# Patient Record
Sex: Male | Born: 1995 | Race: Black or African American | Hispanic: No | Marital: Single | Smoking: Never smoker
Health system: Southern US, Community
[De-identification: ages and names within clinical notes are randomized; demographics above are authoritative.]

## PROBLEM LIST (undated history)

## (undated) ENCOUNTER — Emergency Department (HOSPITAL_COMMUNITY): Admission: EM | Payer: Self-pay | Source: Home / Self Care

---
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
## 2019-11-25 ENCOUNTER — Other Ambulatory Visit: Payer: Self-pay

## 2019-11-25 ENCOUNTER — Emergency Department (HOSPITAL_BASED_OUTPATIENT_CLINIC_OR_DEPARTMENT_OTHER)
Admission: EM | Admit: 2019-11-25 | Discharge: 2019-11-25 | Disposition: A | Discharge status: Home / Self Care | Payer: Self-pay | Attending: Emergency Medicine | Admitting: Emergency Medicine

## 2019-11-25 ENCOUNTER — Encounter (HOSPITAL_BASED_OUTPATIENT_CLINIC_OR_DEPARTMENT_OTHER): Payer: Self-pay | Admitting: Emergency Medicine

## 2019-11-25 DIAGNOSIS — R519 Headache, unspecified: Secondary | ICD-10-CM | POA: Insufficient documentation

## 2019-11-25 DIAGNOSIS — R109 Unspecified abdominal pain: Secondary | ICD-10-CM | POA: Insufficient documentation

## 2019-11-25 DIAGNOSIS — Z20822 Contact with and (suspected) exposure to covid-19: Secondary | ICD-10-CM | POA: Insufficient documentation

## 2019-11-25 LAB — RESPIRATORY PANEL BY RT PCR (FLU A&B, COVID)
Influenza A by PCR: NEGATIVE
Influenza B by PCR: NEGATIVE
SARS Coronavirus 2 by RT PCR: NEGATIVE

## 2019-11-25 NOTE — ED Triage Notes (Signed)
Pt didn't feel good at work 3 days ago and was told to get a COVID test. States he feels fine now.

## 2019-11-25 NOTE — ED Provider Notes (Signed)
[facility] EMERGENCY DEPARTMENT Provider Note   CSN: [id number] Arrival date & time: 11/25/19  1615     History Chief Complaint  Patient presents with  . COVID test    Allen Mullen is a 24 y.o. male with no known past medical history.  HPI Patient presents to emergency department today with chief complaint of needing a Covid test.  Patient states he was at work x3 days ago and he had a headache and normalized abdominal pain.  The headache felt like once he has had in the past.  He states he did not feel good and want to go home.  His employer said in order to return to work he would need a Covid test.  Patient states after going home and resting all the symptoms have resolved.  No medications were needed for his symptoms.  He is currently asymptomatic.    History reviewed. No pertinent past medical history.  There are no problems to display for this patient.   History reviewed. No pertinent surgical history.     No family history on file.  Social History   Tobacco Use  . Smoking status: Never Smoker  . Smokeless tobacco: Never Used  Substance Use Topics  . Alcohol use: No  . Drug use: No    Home Medications Prior to Admission medications   Not on File    Allergies    Patient has no known allergies.  Review of Systems   Review of Systems All other systems are reviewed and are negative for acute change except as noted in the HPI.  Physical Exam Updated Vital Signs BP 122/65 (BP Location: Left Arm)   Pulse 88   Temp 98.2 F (36.8 C) (Oral)   Resp 18   Ht 6\' 1"  (1.854 m)   Wt 106.6 kg   SpO2 97%   BMI 31.00 kg/m   Physical Exam Vitals and nursing note reviewed.  Constitutional:      Appearance: He is well-developed. He is not ill-appearing or toxic-appearing.  HENT:     Head: Normocephalic and atraumatic.     Nose: Nose normal.  Eyes:     General: No scleral icterus.       Right eye: No discharge.        Left eye: No discharge.      Conjunctiva/sclera: Conjunctivae normal.  Neck:     Vascular: No JVD.  Cardiovascular:     Rate and Rhythm: Normal rate and regular rhythm.     Pulses: Normal pulses.     Heart sounds: Normal heart sounds.  Pulmonary:     Effort: Pulmonary effort is normal.     Breath sounds: Normal breath sounds.  Abdominal:     General: There is no distension.  Musculoskeletal:        General: Normal range of motion.     Cervical back: Normal range of motion.  Skin:    General: Skin is warm and dry.  Neurological:     Mental Status: He is oriented to person, place, and time.     GCS: GCS eye subscore is 4. GCS verbal subscore is 5. GCS motor subscore is 6.     Comments: Fluent speech, no facial droop.  Psychiatric:        Behavior: Behavior normal.     ED Results / Procedures / Treatments   Labs (all labs ordered are listed, but only abnormal results are displayed) Labs Reviewed  RESPIRATORY PANEL BY RT PCR (  FLU A&B, COVID)    EKG None  Radiology No results found.  Procedures Procedures (including critical care time)  Medications Ordered in ED Medications - No data to display  ED Course  I have reviewed the triage vital signs and the nursing notes.  Pertinent labs & imaging results that were available during my care of the patient were reviewed by me and considered in my medical decision making (see chart for details).    MDM Rules/Calculators/A&P                          History provided by patient with additional history obtained from chart review.    Patient seen and examined. Patient presents awake, alert, hemodynamically stable, afebrile, non toxic.  He is asymptomatic, only requesting a COVID test if he can return to work.  Exam is without any findings.  Covid test is pending.  He knows to quarantine until he has a test result.  If negative he can return to work, he will look on MyChart for the test result.  No indications for further emergent work-up.  The  patient appears reasonably screened and/or stabilized for discharge and I doubt any other medical condition or other [facility] requiring further screening, evaluation, or treatment in the ED at this time prior to discharge. The patient is safe for discharge with strict return precautions discussed.   Seydou L Deleonardis was evaluated in Emergency Department on 11/25/2019 for the symptoms described in the history of present illness. He was evaluated in the context of the global COVID-19 pandemic, which necessitated consideration that the patient might be at risk for infection with the SARS-CoV-2 virus that causes COVID-19. Institutional protocols and algorithms that pertain to the evaluation of patients at risk for COVID-19 are in a state of rapid change based on information released by regulatory bodies including the CDC and federal and state organizations. These policies and algorithms were followed during the patient's care in the ED.   Portions of this note were generated with Scientist, clinical (histocompatibility and immunogenetics). Dictation errors may occur despite best attempts at proofreading.   Final Clinical Impression(s) / ED Diagnoses Final diagnoses:  Encounter for laboratory testing for COVID-19 virus    Rx / DC Orders ED Discharge Orders    None       Sherene Sires, PA-C 11/25/19 1703    Terald Sleeper, MD 11/26/19 [phone]

## 2019-11-25 NOTE — Discharge Instructions (Addendum)
You were tested today for Covid.  If it is positive you will need to stay out of work for 10 days.  If it is negative you are able to return to work.  Your result will be available for you to see in your MyChart.  Into the emergency department for any new or worsening symptoms.

## 2020-01-15 ENCOUNTER — Encounter (HOSPITAL_COMMUNITY): Payer: Self-pay

## 2020-01-15 ENCOUNTER — Other Ambulatory Visit: Payer: Self-pay

## 2020-01-15 ENCOUNTER — Ambulatory Visit (HOSPITAL_COMMUNITY)
Admission: EM | Admit: 2020-01-15 | Discharge: 2020-01-15 | Disposition: A | Discharge status: Home / Self Care | Payer: Self-pay | Attending: Family Medicine | Admitting: Family Medicine

## 2020-01-15 DIAGNOSIS — R109 Unspecified abdominal pain: Secondary | ICD-10-CM | POA: Insufficient documentation

## 2020-01-15 DIAGNOSIS — Z202 Contact with and (suspected) exposure to infections with a predominantly sexual mode of transmission: Secondary | ICD-10-CM | POA: Insufficient documentation

## 2020-01-15 MED ORDER — DOXYCYCLINE HYCLATE 100 MG PO CAPS: 100.0000 mg | ORAL_CAPSULE | Freq: Two times a day (BID) | ORAL | 0 refills | Status: AC

## 2020-01-15 NOTE — ED Triage Notes (Signed)
Pt presents with what he describes as " not really abdominal pain" but a gnawing, nagging weirdness in his stomach that leaves him with no appetite.

## 2020-01-15 NOTE — Discharge Instructions (Signed)
You have been given the following medicaiton for treatment of possible chlamydia:  Please pick up your prescription for doxycycline 100 mg and begin taking twice daily for the next seven (7) days.  Even though we have treated you today, we have sent testing for sexually transmitted infections. We will notify you of any positive results once they are received. If required, we will prescribe any medications you might need.  Please refrain from all sexual activity for at least the next seven days.

## 2020-01-15 NOTE — ED Provider Notes (Signed)
[facility] CARE [facility]ENTER   [id number] 01/15/20 Arrival Time: 0859  ASSESSMENT & PLAN:  1. STD exposure   2. Abdominal discomfort     Declines empiric gonorrhea tx.   Discharge Instructions     You have been given the following medicaiton for treatment of possible chlamydia:  Please pick up your prescription for doxycycline 100 mg and begin taking twice daily for the next seven (7) days.  Even though we have treated you today, we have sent testing for sexually transmitted infections. We will notify you of any positive results once they are received. If required, we will prescribe any medications you might need.  Please refrain from all sexual activity for at least the next seven days.     Pending: Labs Reviewed  CYTOLOGY, (ORAL, ANAL, URETHRAL) ANCILLARY ONLY    Will notify of any positive results. Instructed to refrain from sexual activity for at least seven days.  Reviewed expectations re: course of current medical issues. Questions answered. Outlined signs and symptoms indicating need for more acute intervention. Patient verbalized understanding. After Visit Summary given.   SUBJECTIVE:  Allen Mullen is a 24 y.o. male who presents with complaint of penile discharge and possible exp to chlamydia; one male sexual partner who recently tested positive for chlamydia. Has noted a very small amt of penile d/c over the past few weeks; sporadic. Also with occasional "funny feeling in my stomach"; occasional discomfort; no specific pain. Normal appetite without n/v/d. No specific aggravating or alleviating factors reported. Denies: urinary frequency, dysuria and gross hematuria. Afebrile. No abdominal or pelvic pain. No n/v. No rashes or lesions.   OBJECTIVE:  Vitals:   01/15/20 0925  BP: 114/75  Pulse: 95  Resp: 18  Temp: 98.9 F (37.2 C)  TempSrc: Oral  SpO2: 99%     General appearance: alert, cooperative, appears stated age and no distress Throat: lips,  mucosa, and tongue normal; teeth and gums normal Lungs: unlabored respirations; speaks full sentences without difficulty Back: no CVA tenderness; FROM at waist Abdomen: soft, non-tender GU: normal appearing genitalia Skin: warm and dry Psychological: alert and cooperative; normal mood and affect.    Labs Reviewed - No data to display  No Known Allergies  History reviewed. No pertinent past medical history. Family History  Family history unknown: Yes   Social History   Socioeconomic History  . Marital status: Single    Spouse name: Not on file  . Number of children: Not on file  . Years of education: Not on file  . Highest education level: Not on file  Occupational History  . Not on file  Tobacco Use  . Smoking status: Never Smoker  . Smokeless tobacco: Never Used  Substance and Sexual Activity  . Alcohol use: No  . Drug use: No  . Sexual activity: Never  Other Topics Concern  . Not on file  Social History Narrative  . Not on file   Social Determinants of Health   Financial Resource Strain:   . Difficulty of Paying Living Expenses: Not on file  Food Insecurity:   . Worried About Programme researcher, broadcasting/film/video in the Last Year: Not on file  . Ran Out of Food in the Last Year: Not on file  Transportation Needs:   . Lack of Transportation (Medical): Not on file  . Lack of Transportation (Non-Medical): Not on file  Physical Activity:   . Days of Exercise per Week: Not on file  . Minutes of Exercise per Session: Not  on file  Stress:   . Feeling of Stress : Not on file  Social Connections:   . Frequency of Communication with Friends and Family: Not on file  . Frequency of Social Gatherings with Friends and Family: Not on file  . Attends Religious Services: Not on file  . Active Member of Clubs or Organizations: Not on file  . Attends Banker Meetings: Not on file  . Marital Status: Not on file  Intimate Partner Violence:   . Fear of Current or Ex-Partner: Not  on file  . Emotionally Abused: Not on file  . Physically Abused: Not on file  . Sexually Abused: Not on file          Mardella Layman, MD 01/15/20 [phone]

## 2020-01-16 LAB — CYTOLOGY, (ORAL, ANAL, URETHRAL) ANCILLARY ONLY
Chlamydia: POSITIVE — AB
Comment: NEGATIVE
Comment: NEGATIVE
Comment: NORMAL
Neisseria Gonorrhea: NEGATIVE
Trichomonas: NEGATIVE

## 2020-02-13 ENCOUNTER — Emergency Department (HOSPITAL_COMMUNITY): Payer: No Typology Code available for payment source

## 2020-02-13 ENCOUNTER — Other Ambulatory Visit: Payer: Self-pay

## 2020-02-13 ENCOUNTER — Emergency Department (HOSPITAL_COMMUNITY)
Admission: EM | Admit: 2020-02-13 | Discharge: 2020-02-14 | Disposition: A | Discharge status: Home / Self Care | Payer: No Typology Code available for payment source | Attending: Emergency Medicine | Admitting: Emergency Medicine

## 2020-02-13 ENCOUNTER — Encounter (HOSPITAL_COMMUNITY): Payer: Self-pay | Admitting: *Deleted

## 2020-02-13 DIAGNOSIS — E876 Hypokalemia: Secondary | ICD-10-CM

## 2020-02-13 DIAGNOSIS — S022XXA Fracture of nasal bones, initial encounter for closed fracture: Secondary | ICD-10-CM | POA: Insufficient documentation

## 2020-02-13 DIAGNOSIS — W320XXA Accidental handgun discharge, initial encounter: Secondary | ICD-10-CM | POA: Insufficient documentation

## 2020-02-13 DIAGNOSIS — Z20822 Contact with and (suspected) exposure to covid-19: Secondary | ICD-10-CM | POA: Insufficient documentation

## 2020-02-13 DIAGNOSIS — Y9259 Other trade areas as the place of occurrence of the external cause: Secondary | ICD-10-CM | POA: Diagnosis not present

## 2020-02-13 DIAGNOSIS — S0992XA Unspecified injury of nose, initial encounter: Secondary | ICD-10-CM | POA: Diagnosis present

## 2020-02-13 LAB — I-STAT CHEM 8, ED
BUN: 12 mg/dL (ref 6–20)
Calcium, Ion: 1.06 mmol/L — ABNORMAL LOW (ref 1.15–1.40)
Chloride: 104 mmol/L (ref 98–111)
Creatinine, Ser: 1.1 mg/dL (ref 0.61–1.24)
Glucose, Bld: 114 mg/dL — ABNORMAL HIGH (ref 70–99)
HCT: 48 % (ref 39.0–52.0)
Hemoglobin: 16.3 g/dL (ref 13.0–17.0)
Potassium: 2.6 mmol/L — CL (ref 3.5–5.1)
Sodium: 140 mmol/L (ref 135–145)
TCO2: 20 mmol/L — ABNORMAL LOW (ref 22–32)

## 2020-02-13 LAB — CBC
HCT: 44.9 % (ref 39.0–52.0)
Hemoglobin: 15.3 g/dL (ref 13.0–17.0)
MCH: 28.3 pg (ref 26.0–34.0)
MCHC: 34.1 g/dL (ref 30.0–36.0)
MCV: 83.1 fL (ref 80.0–100.0)
Platelets: 245 10*3/uL (ref 150–400)
RBC: 5.4 MIL/uL (ref 4.22–5.81)
RDW: 13.8 % (ref 11.5–15.5)
WBC: 6.3 10*3/uL (ref 4.0–10.5)
nRBC: 0 % (ref 0.0–0.2)

## 2020-02-13 LAB — COMPREHENSIVE METABOLIC PANEL
ALT: 20 U/L (ref 0–44)
AST: 23 U/L (ref 15–41)
Albumin: 4.2 g/dL (ref 3.5–5.0)
Alkaline Phosphatase: 71 U/L (ref 38–126)
Anion gap: 15 (ref 5–15)
BUN: 11 mg/dL (ref 6–20)
CO2: 19 mmol/L — ABNORMAL LOW (ref 22–32)
Calcium: 9.1 mg/dL (ref 8.9–10.3)
Chloride: 101 mmol/L (ref 98–111)
Creatinine, Ser: 1.28 mg/dL — ABNORMAL HIGH (ref 0.61–1.24)
GFR, Estimated: >60 mL/min (ref >60)
Glucose, Bld: 119 mg/dL — ABNORMAL HIGH (ref 70–99)
Potassium: 2.5 mmol/L — CL (ref 3.5–5.1)
Sodium: 135 mmol/L (ref 135–145)
Total Bilirubin: 0.6 mg/dL (ref 0.3–1.2)
Total Protein: 7.2 g/dL (ref 6.5–8.1)

## 2020-02-13 LAB — PROTIME-INR
INR: 1.1 (ref 0.8–1.2)
Prothrombin Time: 13.6 seconds (ref 11.4–15.2)

## 2020-02-13 LAB — SAMPLE TO BLOOD BANK

## 2020-02-13 LAB — LACTIC ACID, PLASMA: Lactic Acid, Venous: 3.1 mmol/L (ref 0.5–1.9)

## 2020-02-13 LAB — RESP PANEL BY RT-PCR (FLU A&B, COVID) ARPGX2
Influenza A by PCR: NEGATIVE
Influenza B by PCR: NEGATIVE
SARS Coronavirus 2 by RT PCR: NEGATIVE

## 2020-02-13 LAB — ETHANOL: Alcohol, Ethyl (B): <10 mg/dL (ref <10)

## 2020-02-13 MED ORDER — LIDOCAINE-EPINEPHRINE 1 %-1:100000 IJ SOLN
20.0000 mL | Freq: Once | INTRAMUSCULAR | Status: AC
  Administered 2020-02-13: 20 mL
  Filled 2020-02-13: qty 1

## 2020-02-13 MED ORDER — SODIUM CHLORIDE 0.9 % IV BOLUS
1000.0000 mL | Freq: Once | INTRAVENOUS | Status: AC
  Administered 2020-02-13: 22:00:00 1000 mL via INTRAVENOUS

## 2020-02-13 MED ORDER — TETANUS-DIPHTH-ACELL PERTUSSIS 5-2.5-18.5 LF-MCG/0.5 IM SUSY: 0.5000 mL | PREFILLED_SYRINGE | Freq: Once | INTRAMUSCULAR | Status: DC

## 2020-02-13 MED ORDER — TETANUS-DIPHTHERIA TOXOIDS TD 5-2 LFU IM INJ
0.5000 mL | INJECTION | Freq: Once | INTRAMUSCULAR | Status: DC
  Filled 2020-02-13: qty 0.5

## 2020-02-13 MED ORDER — POTASSIUM CHLORIDE CRYS ER 20 MEQ PO TBCR
40.0000 meq | EXTENDED_RELEASE_TABLET | Freq: Once | ORAL | Status: AC
  Administered 2020-02-14: 40 meq via ORAL
  Filled 2020-02-13: qty 2

## 2020-02-13 NOTE — ED Notes (Signed)
ED Provider at bedside for suturing.  

## 2020-02-13 NOTE — ED Notes (Signed)
Returned from ct, [facility] at bedside.

## 2020-02-13 NOTE — Progress Notes (Signed)
   02/13/20 2200  Clinical Encounter Type  Visited With Patient not available  Visit Type Trauma  Referral From Nurse  Consult/Referral To Chaplain  The chaplain responded to Trauma 1 page. No family present. The chaplain will stay in ED to assess needs. No chaplain services needed at this time. The chaplain will follow up as needed.  

## 2020-02-13 NOTE — ED Notes (Signed)
Transported to CT 
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
## 2020-02-14 LAB — URINALYSIS, ROUTINE W REFLEX MICROSCOPIC
Bilirubin Urine: NEGATIVE
Glucose, UA: NEGATIVE mg/dL
Hgb urine dipstick: NEGATIVE
Ketones, ur: NEGATIVE mg/dL
Leukocytes,Ua: NEGATIVE
Nitrite: NEGATIVE
Protein, ur: NEGATIVE mg/dL
Specific Gravity, Urine: 1.013 (ref 1.005–1.030)
pH: 6 (ref 5.0–8.0)

## 2020-02-14 MED ORDER — DOXYCYCLINE HYCLATE 100 MG PO CAPS: 100.0000 mg | ORAL_CAPSULE | Freq: Two times a day (BID) | ORAL | 0 refills | Status: AC

## 2020-02-14 MED ORDER — POTASSIUM CHLORIDE ER 10 MEQ PO TBCR: 10.0000 meq | EXTENDED_RELEASE_TABLET | Freq: Every day | ORAL | 5 refills | Status: AC
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
## 2020-02-14 NOTE — ED Notes (Signed)
Pt was ambulatory for d/c. Pt given both verbal and written discharge instructions, which he verbalized understanding of the same. Verbalized understanding of prescriptions and f/u information given at time of d/c.

## 2020-02-14 NOTE — Discharge Instructions (Signed)
You have been seen and discharged from the emergency department.  You have nasal bone fractures that need to be followed up as an outpatient with ENT, use the attached information. Take antibiotic as prescribed. Your potassium was low today, take potassium supplement as prescribed for the next 5 days and have blood work repeated. Follow-up with your primary provider for reevaluation. Take home medications as prescribed. If you have any worsening symptoms or further concerns or health please return to emergency department for further evaluation.
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
